# Patient Record
Sex: Male | Born: 1940 | Race: White | Hispanic: No | Marital: Married
Health system: Southern US, Community
[De-identification: ages and names within clinical notes are randomized; demographics above are authoritative.]

---

## 1998-04-11 ENCOUNTER — Emergency Department (HOSPITAL_COMMUNITY): Admission: EM | Admit: 1998-04-11 | Discharge: 1998-04-11 | Payer: Self-pay | Admitting: Emergency Medicine

## 2003-10-22 ENCOUNTER — Other Ambulatory Visit: Payer: Self-pay

## 2007-01-23 ENCOUNTER — Ambulatory Visit (HOSPITAL_COMMUNITY): Admission: RE | Admit: 2007-01-23 | Discharge: 2007-01-23 | Payer: Self-pay | Admitting: Cardiovascular Disease

## 2007-02-12 ENCOUNTER — Inpatient Hospital Stay (HOSPITAL_COMMUNITY): Admission: RE | Admit: 2007-02-12 | Discharge: 2007-02-13 | Payer: Self-pay | Admitting: *Deleted

## 2007-02-12 ENCOUNTER — Ambulatory Visit: Payer: Self-pay | Admitting: *Deleted

## 2007-02-12 ENCOUNTER — Encounter (INDEPENDENT_AMBULATORY_CARE_PROVIDER_SITE_OTHER): Payer: Self-pay | Admitting: Specialist

## 2007-03-01 ENCOUNTER — Ambulatory Visit: Payer: Self-pay | Admitting: *Deleted

## 2007-09-20 ENCOUNTER — Ambulatory Visit: Payer: Self-pay | Admitting: *Deleted

## 2009-04-14 ENCOUNTER — Emergency Department: Payer: Self-pay

## 2010-07-01 ENCOUNTER — Emergency Department: Payer: Self-pay | Admitting: Emergency Medicine

## 2011-02-15 NOTE — Op Note (Signed)
NAME:  MICHOLAS, DRUMWRIGHT NO.:  [id number]   MEDICAL RECORD NO.:  [id number]          PATIENT TYPE:  INP   LOCATION:  3304                         FACILITY:  [facility]   PHYSICIAN:  Balinda Quails, M.D.    DATE OF BIRTH:  December 31, 1940   DATE OF PROCEDURE:  02/12/2007  DATE OF DISCHARGE:                               OPERATIVE REPORT   SURGEON:  Denman George, M.D.   ASSISTANT:  This nurse.   ANESTHETIC:  General endotracheal.   ANESTHESIOLOGIST:  Dr. Jacklynn Bue   PREOPERATIVE DIAGNOSIS:  Severe left internal carotid artery stenosis.   POSTOPERATIVE DIAGNOSIS:  Severe left internal carotid artery stenosis.   PROCEDURE:  Left carotid endarterectomy with Dacron patch angioplasty.   CLINICAL NOTE:  Patrick Mccarthy is a 70 year old male referred by Dr.  Allyson Sabal after cerebral arteriography revealed a severe internal carotid  artery stenosis on the left.  He was seen in consultation, recommended  he undergo left carotid endarterectomy for reduction of stroke risk.  He  consented for surgery.  Brought to the operating at this time for left  carotid endarterectomy.  Risks and benefits the operative procedure  explained to the patient in detail with a major morbidity mortality of 1-  2% to include but not limited to MI, CVA, cranial nerve injury and  stroke.   OPERATIVE PROCEDURE:  The patient brought to the operating room in  stable hemodynamic condition.  Placed under general endotracheal  anesthesia.  Arterial line, Foley catheter in place.  Left neck prepped  and draped in sterile fashion.   Curvilinear skin incision made along the anterior border left  sternomastoid muscle.  Subcutaneous tissue and platysma divided with  electrocautery.  Deep dissection carried down along the sternomastoid  muscle.  Facial vein ligated with 3-0 silk and divided.  The carotid  bifurcation exposed.  The common carotid artery mobilized down to the  omohyoid muscle encircled with vessel  loop.  Vagus nerve reflected  posteriorly and preserved.  The superior thyroid external carotid freed  and encircled Vessel loops.  The patient did develop some bradycardia,  the carotid body was injected with 1% Xylocaine.  The internal carotid  artery then followed distally up the posterior belly of the digastric  muscle.  The hypoglossal nerve retracted superiorly.  The distal  internal carotid artery encircled with vessel loop.   Inspection of the carotid bifurcation verified extensive plaque disease  extending into the origin of left internal carotid artery.   The patient administered 7000 units heparin intravenously.  Adequate  circulation time admitted.  Carotid vessels controlled with clamps.  Longitudinal arteriotomy made in the distal common carotid artery.  The  arteriotomy extended across carotid bulb and up into the internal  carotid artery.  There was a large amount of plaque in the origin of the  left internal carotid artery with a high-grade stenosis estimated to be  greater than 90%.  Shunt was inserted.   The plaque removed with endarterectomy elevator.  The endarterectomy  carried down to the common carotid artery where plaque was divided  transversely Potts  scissors.  Plaque then raised up to the bulb with  superior thyroid external carotid were endarterectomized using an  eversion technique.  The distal internal carotid plaque feathered out  well.   The site irrigated with heparin saline solution and fragments of plaque  removed with fine forceps.  The patch angioplasty of the endarterectomy  site carried out with a finesse Dacron patch using running 6-0 Prolene  suture.  The shunt then removed.  All vessels well flushed.  Clamps  removed directing initial antegrade flow up the external carotid artery,  following this internal carotid was released.   Adequate hemostasis obtained.  The patient administered 50 mg protamine  intravenously.  Excellent Doppler signal  in the left internal carotid  artery.   The sternomastoid fascia closed with running 2-0 Vicryl suture.  Platysma closed running 3-0 Vicryl suture.  Skin closed with 4-0  Monocryl.  Dermabond applied.  Sterile dressing applied.   The patient tolerated procedure well.  Transferred to recovery room in  stable condition.      Balinda Quails, M.D.  Electronically Signed     PGH/MEDQ  D:  02/12/2007  T:  02/12/2007  Job:  [id number]   cc:   Nanetta Batty, M.D.

## 2011-02-15 NOTE — Assessment & Plan Note (Signed)
OFFICE VISIT   PRESTEN, JOOST  DOB:  [birth year]                                       03/01/2007  ZOXWR#:[id number]   HISTORY:  This is a 70 year old male status post left carotid  endarterectomy for severe stenosis carried out 02/12/07.  This was an  uneventful operative procedure, he was discharged home in good condition  02/13/07.  He did have some mild swelling in his neck following surgery.   At this time he has no  major complaints.  Blood pressure is 164/68 in  the right arm, 143/77 in the left arm.  Pulse is 57 per minute,  respirations 18 per minute.  Left neck incision healing unremarkably.  Mild to moderate residual swelling noted.  Cranial nerves intact.  Strength equal bilaterally.   The patient is taking aspirin daily.  To continue aspirin as instructed.  Return in six months for protocol follow-up carotid Doppler.  No  apparent complications evident at this time.   Balinda Quails, M.D.  Electronically Signed   PGH/MEDQ  D:  03/01/2007  T:  03/01/2007  Job:  [id number]   cc:   Nanetta Batty, M.D.

## 2011-02-15 NOTE — Discharge Summary (Signed)
NAME:  Patrick Mccarthy, Patrick Mccarthy NO.:  [id number]   MEDICAL RECORD NO.:  [id number]          PATIENT TYPE:  INP   LOCATION:  3304                         FACILITY:  [facility]   PHYSICIAN:  Balinda Quails, M.D.    DATE OF BIRTH:  Nov 05, 1940   DATE OF ADMISSION:  02/12/2007  DATE OF DISCHARGE:  02/13/2007                               DISCHARGE SUMMARY   ADMISSION DIAGNOSIS:  Severe left internal carotid artery stenosis.   DISCHARGE/SECONDARY DIAGNOSES:  1. Severe left internal carotid artery stenosis, asymptomatic, status      post left carotid endarterectomy.  2. Diabetes mellitus type 2.  3. Chronic obstructive pulmonary disease.  4. Hypertension.  5. Peripheral vascular disease.  6. Hyperlipidemia.  7. Ongoing tobacco abuse.  8. Obesity.  9. History of umbilical hernia repair in the 1980s.  10.History of appendectomy.  11.Allergic rhinitis.  12.History of tension headaches.  13.Osteoarthritis, particularly of the knees.   ALLERGIES:  NO KNOWN DRUG ALLERGIES.   PROCEDURE:  Feb 12, 2007, left carotid endarterectomy with Dacron patch  angioplasty by Dr. Denman George.   BRIEF HISTORY:  Patrick Mccarthy is a 70 year old Caucasian male with history  of diabetes mellitus and tobacco abuse.  He underwent cerebral  angiography on January 23, 2007, revealing severe left internal carotid  artery stenosis.  Some intracerebral atherosclerotic vascular disease  was also noted.  Dominant vertebral flow was to the left.  There was no  significant right internal carotid artery stenosis.  Based on these  results, Dr. Allyson Sabal asked vascular surgeon, Dr. Madilyn Fireman, to see in  consultation for consideration of carotid endarterectomy.  Dr. Madilyn Fireman  recommended left carotid endarterectomy to reduce his risk for future  stroke.   HOSPITAL COURSE:  Patrick Mccarthy was electively admitted to [facility] on Feb 12, 2007, and underwent a left carotid endarterectomy.  He was extubated neurologically  intact and after short stay in recovery  unit was transferred to step-down unit 3300 where he remained until  discharge.  Overnight he remained stable and on the morning of  postoperative day #1, he was already voiding, ambulating and tolerating  p.o.'s.  He denied dysphagia, nausea and vomiting or shortness of  breath.  Vitals were stable with blood pressure 134/83, heart rate was  in sinus rhythm, primarily in the mid 50s to mid 60s.  He was saturating  94% on room air and was afebrile.  Blood glucose levels were suboptimal  with some over 200 and his home regimen of antihyperglycemics were  initiated as well as NovoLog insulin sliding scale.  His postoperative  labs were stable showing a white blood count of 10.8, hemoglobin 12.2,  hematocrit 36.0, platelet count 201.  Sodium 136, potassium 4, BUN 11,  creatinine 0.84, blood glucose 206.  On exam, heart had a regular rate  and rhythm.  Lung sounds showed fine basilar crackles and abdominal exam  was benign.  Neurologically he was intact and his tongue was midline.  His incision showed some mild soft tissue swelling and evidence of a  probable tiny hematoma at  the superior aspect of his incision, but again  this was quite small and the patient was asymptomatic.  There is no  erythema.  His hemoglobin and hematocrit have remained stable  postoperatively.  By later that morning Mr. Erway was felt appropriate  for discharge home in stable condition.  He is discharged home on postop  day #2, December 14, 2006.   DISCHARGE MEDICATIONS:  1. Tylox 1-2 tablets p.o. q.4h. p.r.n. pain.  2. Pravastatin 80 mg p.o. q.a.m.  3. Metformin 500 mg 2 tablets p.o. q.a.m. and 500 mg p.o. q.p.m.  4. Aspirin 325 mg p.o. daily.  5. Lisinopril 20 mg p.o. q.p.m.  6. Metoprolol 50 mg p.o. b.i.d.  7. Glipizide ER 10 mg p.o. q.a.m.  8. Zetia 10 mg p.o. q.a.m.  9. Aleve 2 tablets p.o. q.12h. p.r.n.   DISCHARGE INSTRUCTIONS:  He is to increase activity slowly,  avoid  driving or heavy lifting for the next 2 weeks.  He may shower and clean  his incisions with soap and water.  He should call if he develops  redness or drainage for his incision site or fever than 101.  He is to  continue diabetic appropriate diet.  He is to follow up with Dr. Madilyn Fireman  at the [facility] office on Mar 01, 2007, at 11:30 a.m.      Patrick Mccarthy, P.A.      Balinda Quails, M.D.  Electronically Signed    AWZ/MEDQ  D:  02/13/2007  T:  02/13/2007  Job:  [id number]   cc:   Patrick Mccarthy, M.D.  Patrick Mccarthy

## 2011-02-15 NOTE — Procedures (Signed)
CAROTID DUPLEX EXAM   INDICATION:  Followup of known carotid artery disease.  Patient is  asymptomatic.   HISTORY:  Diabetes:  Yes.  Cardiac:  No.  Hypertension:  Yes.  Smoking:  Yes, one pack per day.  Patient with COPD.  Previous Surgery:  Left CEA with DPA on 02/12/07 by Dr. Madilyn Fireman.  CV History:  Amaurosis Fugax No, Paresthesias No, Hemiparesis No                                       RIGHT             LEFT  Brachial systolic pressure:         168               148  Brachial Doppler waveforms:         WNL               Slightly  diminished  Vertebral direction of flow:        Antegrade         Antegrade  (dominant)  DUPLEX VELOCITIES (cm/sec)  CCA peak systolic                   89                103  ECA peak systolic                   148               104  ICA peak systolic                   59                47  ICA end diastolic                   20                23  PLAQUE MORPHOLOGY:                  Homogenous        None  PLAQUE AMOUNT:                      Minimal           N/A  PLAQUE LOCATION:                    Bifurcation, PICA N/A   IMPRESSION:  1. Right 1-19% internal carotid artery stenosis.  2. Left internal carotid artery without recurrent stenosis, status      post carotid endarterectomy.  3. Patent external carotid arteries bilaterally.  4. Bilateral antegrade flow in vertebral arteries with left vertebral      artery dominant.  5. Known differential in brachial systolic pressures, right > left.   ___________________________________________  P. Liliane Bade, M.D.   PB/MEDQ  D:  09/20/2007  T:  09/21/2007  Job:  [id number]

## 2011-02-18 NOTE — Consult Note (Signed)
NAME:  [name], [name] NO.:  [id number]   MEDICAL RECORD NO.:  [id number]          PATIENT TYPE:  AMB   LOCATION:  SDS                          FACILITY:  [facility]   PHYSICIAN:  Balinda Quails, M.D.    DATE OF BIRTH:  04/10/1941   DATE OF CONSULTATION:  01/23/2007  DATE OF DISCHARGE:                                 CONSULTATION   PRIMARY CARE PHYSICIAN:  Dewaine Oats.   REASON FOR CONSULTATION:  Severe left internal carotid artery stenosis.   HISTORY:  Patrick Mccarthy is a 70 year old male who is status post four-  vessel cerebral arteriography today.  He was referred by Dr. Allyson Sabal  following arteriography which reveals a severe left internal carotid  artery stenosis.  This was initially picked up on carotid Doppler  evaluation.   The patient has no history of stroke.  He denies sensory, motor, or  visual deficit.  Denies gait abnormality.   PAST MEDICAL HISTORY:  1. Type 2 diabetes.  2. Tobacco abuse.  3. Peripheral vascular disease.  4. COPD.  5. Hypertension.  6. Hyperlipidemia.  7. Obesity.   MEDICATIONS:  1. Pravastatin 80 mg daily.  2. Metformin 1000 mg every morning and 500 mg every evening.  3. Aspirin 325 mg daily.  4. Lisinopril 20 mg every bedtime.  5. Metoprolol ER 50 mg twice a day.  6. Glipizide ER 10 mg daily.  7. Zetia 10 mg daily.   ALLERGIES:  NONE KNOWN.   REVIEW OF SYSTEMS:   Dictation ended at this point.      Balinda Quails, M.D.  Electronically Signed     PGH/MEDQ  D:  01/23/2007  T:  01/23/2007  Job:  [id number]   cc:   Nanetta Batty, M.D.  Dewaine Oats

## 2011-02-18 NOTE — Cardiovascular Report (Signed)
NAME:  ARDON, FRANKLIN NO.:  [id number]   MEDICAL RECORD NO.:  [id number]          PATIENT TYPE:  AMB   LOCATION:  SDS                          FACILITY:  [facility]   PHYSICIAN:  Nanetta Batty, M.D.   DATE OF BIRTH:  1940-12-23   DATE OF PROCEDURE:  01/23/2007  DATE OF DISCHARGE:                            CARDIAC CATHETERIZATION   PERIPHERAL ANGIOGRAM REPORT:  Mr. Pyon is a 70 year old gentleman, a patient of Dr. Verl Dicker and Dr.  Maree Krabbe.  He has a history of COPD, diabetes and asymptomatic carotid  disease, as well as claudication.  He also has non-insulin-requiring  diabetes and hyperlipidemia.  He presents now after having undergone  cerebral angiography for abdominal aortography with bifemoral runoff.   PROCEDURE DESCRIPTION:  The 5-French sheath was used that was placed in  the right femoral artery using the standard Seldinger technique, with a  long tennis racket catheter and Visipaque dye.  Retrograde aortic  pressures monitored during the case.   ANGIOGRAPHIC RESULTS:  1. Abdominal aorta.      a.     Renal arteries - normal.      b.     Internal abdominal aortographs - normal.  2. Left lower extremity; 80% segmental mid left SFA stenosis with two-      vessel runoff.  Anterior tibial appeared to be occluded.  3. Right lower extremity; 30-40% ostial right common iliac artery      stenosis.      a.     80% segmental mid-right SFA with two-vessel runoff to the       anterior tibial appear to be occluded.   IMPRESSION:  Mr. Purtee has high-grade segmental mid bilateral SFA  disease amenable to endovascular therapy.  Because of his carotid  disease, we will plan on treating the carotid first followed by staged  treatment of his SFA for symptomatic claudication.   The sheath was removed and pressure to the groin which achieved good  hemostasis.  The patient left the lab in stable condition.  He will be  hydrated, discharged home and seen as an outpatient, to  see Dr. Jacinto Halim  back in follow-up.  Dr. Jacinto Halim was made aware of these results.      Nanetta Batty, M.D.  Electronically Signed     JB/MEDQ  D:  01/23/2007  T:  01/23/2007  Job:  [id number]   cc:   [facility]  [place] R. Jacinto Halim, MD  Dewaine Oats

## 2011-02-18 NOTE — Consult Note (Signed)
NAME:  Patrick Mccarthy, VANTINE NO.:  [id number]   MEDICAL RECORD NO.:  [id number]          PATIENT TYPE:  AMB   LOCATION:  SDS                          FACILITY:  [facility]   PHYSICIAN:  Balinda Quails, M.D.    DATE OF BIRTH:  [birth year]   DATE OF CONSULTATION:  01/23/2007  DATE OF DISCHARGE:                                 CONSULTATION   REFERRING PHYSICIAN:  Nanetta Batty M.D.   PRIMARY CARE PHYSICIAN:  Dewaine Oats.   REASON FOR CONSULTATION:  Severe left internal carotid artery stenosis.   HISTORY:  Mr. Cornellius Kropp is a 70 year old male with a history of  diabetes and tobacco abuse.  He underwent cerebral arteriography today.  This reveals a severe left internal carotid artery stenosis.  He also  does have some intracerebral atherosclerotic vascular disease.  Dominant  vertebral flow to the left.  No significant right ICA stenosis.   The patient denies a history of stroke.  No sensory, motor, or visual  deficit.  No speech problems.  No gait abnormalities.   PAST MEDICAL HISTORY:  1. Type 2 diabetes.  2. COPD.  3. Hypertension.  4. Peripheral vascular disease.  5. Hyperlipidemia.  6. Tobacco abuse.  7. Obesity.   MEDICATIONS:  1. Pravastatin 80 mg daily.  2. Metformin 1000 mg q. a.m. and 500 mg q. p.m.  3. Aspirin 325 mg daily.  4. Lisinopril 20 mg nightly.  5. Metoprolol ER 50 mg b.i.d.  6. Glipizide ER 10 mg daily.  7. Zetia 10 mg daily.   ALLERGIES:  None known.   REVIEW OF SYSTEMS:  The patient notes some shortness of breath with  ambulation.  Occasional chest pain.  He has bilateral thigh and calf  claudication symptoms.  Denies rest pain or night pain.  Does have some  numbness in his feet.  Occasional lightheadedness and dizziness.   SOCIAL HISTORY:  The patient is married.  He continues to use tobacco,  about a pack a day, 30-pack-year history.  Does not exercise regularly.  He is retired.  Does not consume alcohol on a regular basis.  No  illicit  drug use.   FAMILY HISTORY:  The patient denies a history of premature coronary  disease.  Father died age 2 with a stroke and diabetes.  Mother alive  at age 6.  Two brothers who are younger than he is and two sisters who  are younger, with no history of coronary disease.   PHYSICAL EXAMINATION:  GENERALLY:  Well-appearing 70 year old male.  Alert, oriented.  VITAL SIGNS:  His blood pressure is 120/90, pulse is 64 per minute,  respirations are 18 per minute.  NECK:  He is a soft left carotid bruit.  Neck is supple without  thyromegaly or adenopathy.  CHEST:  Clear with equal air entry bilaterally.  No rales or rhonchi.  HEART SOUNDS:  Normal without murmurs.  ABDOMEN:  Soft and nontender.  Moderate obesity.  No masses,  organomegaly.  Normal bowel sounds.  EXTREMITIES:  With 2+ femoral pulses bilaterally, 1+ popliteal and  posterior tibial pulse bilaterally.  INVESTIGATIONS:  Cerebral arteriography as noted above.  Severe left  internal carotid artery stenosis by Doppler and arteriography.   IMPRESSION:  1. Severe asymptomatic left internal carotid artery stenosis.  2. Type 2 diabetes.  3. Hypertension.  4. Peripheral vascular disease.  5. Chronic obstructive pulmonary disease.  6. Tobacco abuse.  7. Hyperlipidemia.   RECOMMENDATIONS:  Left carotid endarterectomy for reduction of stroke  risk.  This will be scheduled electively through the office.      Balinda Quails, M.D.  Electronically Signed     PGH/MEDQ  D:  01/23/2007  T:  01/23/2007  Job:  [id number]   cc:   Balinda Quails, M.D.  Dewaine Oats

## 2011-02-18 NOTE — Cardiovascular Report (Signed)
NAME:  ARTRELL, LAWLESS NO.:  [id number]   MEDICAL RECORD NO.:  [id number]          PATIENT TYPE:  AMB   LOCATION:  SDS                          FACILITY:  [facility]   PHYSICIAN:  Nanetta Batty, M.D.   DATE OF BIRTH:  September 16, 1941   DATE OF PROCEDURE:  DATE OF DISCHARGE:                            CARDIAC CATHETERIZATION   Mr. Longsworth is a 70 year old gentleman with history of COPD, diabetes, and  asymptomatic carotid disease found on duplex ultrasound with Dopplers  performed in our office January 15, 2007 revealing high-grade left ICA  stenosis.  There was a question of left subclavian stenosis as well;  however, this was not angiographically documented.  He also has  claudication with SFA disease bilaterally by  ultrasound in our office  as well.  He presents now for cerebral angiography with staged lower  extremity angiography.   DESCRIPTION OF PROCEDURE:  The patient brought to the second floor [facility] angiographic suite in the postabsorptive state.  He was not  premedicated.  His right groin was prepped and shaved in the usual  sterile fashion.  1% Xylocaine was used for local anesthesia.  A 5-  French sheath was inserted into the right femoral artery using standard  Seldinger technique.  A 5-French tennis racket catheter and JV1  catheters were used for arch angiography and four-vessel cerebral  angiography.  Visipaque dye was used for the entirety of the case.  The  aortic pressure was monitored during the case.   ANGIOGRAPHIC RESULTS:  1. Arch aorta; type 1 arch.  2. Right vertebral; nonselectively visualized with 50% ostial      stenosis.  This appeared to be a small vessel.  3. Right carotid; normal, filling the anterior cerebral as well.      There were some minor blockages of the intracranial cerebral      vessels that were not high-grade.  4. Left carotid; 99% proximal left ICA stenosis.  This was a small      underfilled vessel that did not fill  the anterior cerebral.  There      was also 40% mid-left common carotid stenosis and a 40-50% ostial      left common carotid stenosis.  5. Left vertebral; large dominant vessel was free of significant      disease.   IMPRESSION:  Mr. Schnoor has high-grade left internal carotid artery  stenosis.  He is asymptomatic.  He has a negative Myoview.  We will  discuss the possibility of carotid artery stenting in the asymptomatic  capture protocol versus carotid endarterectomy.      Nanetta Batty, M.D.  Electronically Signed    JB/MEDQ  D:  01/23/2007  T:  01/23/2007  Job:  [id number]   cc:   2nd Floor [facility]  [facility]  [place] R. Jacinto Halim, MD  Dewaine Oats

## 2011-10-23 ENCOUNTER — Inpatient Hospital Stay: Payer: Self-pay | Admitting: *Deleted

## 2011-10-23 LAB — URINALYSIS, COMPLETE
Bacteria: NONE SEEN
Bilirubin,UR: NEGATIVE
Glucose,UR: >=500 mg/dL (ref 0–75)
Leukocyte Esterase: NEGATIVE
Nitrite: NEGATIVE
Specific Gravity: 1.014 (ref 1.003–1.030)
WBC UR: 1 /HPF (ref 0–5)

## 2011-10-23 LAB — COMPREHENSIVE METABOLIC PANEL
BUN: 22 mg/dL — ABNORMAL HIGH (ref 7–18)
Calcium, Total: 9 mg/dL (ref 8.5–10.1)
Creatinine: 1.45 mg/dL — ABNORMAL HIGH (ref 0.60–1.30)
EGFR (African American): >60
EGFR (Non-African Amer.): 51 — ABNORMAL LOW
Potassium: 4 mmol/L (ref 3.5–5.1)
SGOT(AST): 16 U/L (ref 15–37)
SGPT (ALT): 20 U/L
Total Protein: 6.7 g/dL (ref 6.4–8.2)

## 2011-10-23 LAB — CBC
MCHC: 34.4 g/dL (ref 32.0–36.0)
RDW: 13.5 % (ref 11.5–14.5)

## 2011-10-24 DIAGNOSIS — I6789 Other cerebrovascular disease: Secondary | ICD-10-CM

## 2011-10-24 LAB — CBC WITH DIFFERENTIAL/PLATELET
Basophil #: 0.1 10*3/uL (ref 0.0–0.1)
Eosinophil #: 0.2 10*3/uL (ref 0.0–0.7)
HGB: 11.8 g/dL — ABNORMAL LOW (ref 13.0–18.0)
Lymphocyte #: 1.2 10*3/uL (ref 1.0–3.6)
Lymphocyte %: 15.5 %
MCHC: 33.5 g/dL (ref 32.0–36.0)
Neutrophil #: 5.6 10*3/uL (ref 1.4–6.5)
Platelet: 152 10*3/uL (ref 150–440)
RBC: 4.12 10*6/uL — ABNORMAL LOW (ref 4.40–5.90)
RDW: 14.1 % (ref 11.5–14.5)
WBC: 7.6 10*3/uL (ref 3.8–10.6)

## 2011-10-24 LAB — LIPID PANEL
Cholesterol: 117 mg/dL (ref 0–200)
HDL Cholesterol: 40 mg/dL (ref 40–60)
Ldl Cholesterol, Calc: 59 mg/dL (ref 0–100)
Triglycerides: 91 mg/dL (ref 0–200)

## 2011-10-24 LAB — BASIC METABOLIC PANEL
Anion Gap: 11 (ref 7–16)
BUN: 19 mg/dL — ABNORMAL HIGH (ref 7–18)
Calcium, Total: 8.4 mg/dL — ABNORMAL LOW (ref 8.5–10.1)
Co2: 26 mmol/L (ref 21–32)
Creatinine: 1.25 mg/dL (ref 0.60–1.30)
EGFR (African American): >60
Osmolality: 293 (ref 275–301)
Potassium: 3.8 mmol/L (ref 3.5–5.1)

## 2011-10-24 LAB — CK TOTAL AND CKMB (NOT AT ARMC)
CK, Total: 122 U/L (ref 35–232)
CK-MB: 1.9 ng/mL (ref 0.5–3.6)

## 2011-10-24 LAB — HEMOGLOBIN A1C: Hemoglobin A1C: 9.3 % — ABNORMAL HIGH (ref 4.2–6.3)

## 2011-11-11 ENCOUNTER — Encounter: Payer: Self-pay | Admitting: Internal Medicine

## 2011-12-02 ENCOUNTER — Encounter: Payer: Self-pay | Admitting: Internal Medicine

## 2011-12-20 ENCOUNTER — Emergency Department: Payer: Self-pay | Admitting: Emergency Medicine

## 2011-12-20 LAB — COMPREHENSIVE METABOLIC PANEL
Anion Gap: 13 (ref 7–16)
Calcium, Total: 10.3 mg/dL — ABNORMAL HIGH (ref 8.5–10.1)
Chloride: 106 mmol/L (ref 98–107)
EGFR (African American): >60
Glucose: 109 mg/dL — ABNORMAL HIGH (ref 65–99)
Osmolality: 294 (ref 275–301)
Potassium: 4.2 mmol/L (ref 3.5–5.1)
SGOT(AST): 18 U/L (ref 15–37)
SGPT (ALT): 19 U/L

## 2011-12-20 LAB — CBC
HGB: 13.1 g/dL (ref 13.0–18.0)
MCH: 29.1 pg (ref 26.0–34.0)
Platelet: 218 10*3/uL (ref 150–440)
RBC: 4.5 10*6/uL (ref 4.40–5.90)
RDW: 14.4 % (ref 11.5–14.5)

## 2011-12-20 LAB — URINALYSIS, COMPLETE
Bilirubin,UR: NEGATIVE
Blood: NEGATIVE
Granular Cast: 6
Hyaline Cast: 3
Ketone: NEGATIVE
Leukocyte Esterase: NEGATIVE
Ph: 5 (ref 4.5–8.0)
Protein: 100
RBC,UR: 1 /HPF (ref 0–5)
Squamous Epithelial: <1

## 2011-12-20 LAB — PROTIME-INR: Prothrombin Time: 12.2 secs (ref 11.5–14.7)

## 2013-08-25 ENCOUNTER — Emergency Department: Payer: Self-pay | Admitting: Emergency Medicine

## 2013-08-25 LAB — LIPASE, BLOOD: Lipase: 154 U/L (ref 73–393)

## 2013-08-25 LAB — CBC WITH DIFFERENTIAL/PLATELET
Basophil #: 0.1 10*3/uL (ref 0.0–0.1)
Eosinophil #: 0.2 10*3/uL (ref 0.0–0.7)
Eosinophil %: 1.6 %
HCT: 31 % — ABNORMAL LOW (ref 40.0–52.0)
Lymphocyte #: 1.3 10*3/uL (ref 1.0–3.6)
Lymphocyte %: 9.7 %
MCH: 28.2 pg (ref 26.0–34.0)
MCHC: 32.5 g/dL (ref 32.0–36.0)
Monocyte %: 4.9 %
Neutrophil #: 11.1 10*3/uL — ABNORMAL HIGH (ref 1.4–6.5)
Neutrophil %: 82.9 %
Platelet: 222 10*3/uL (ref 150–440)
RBC: 3.58 10*6/uL — ABNORMAL LOW (ref 4.40–5.90)
WBC: 13.4 10*3/uL — ABNORMAL HIGH (ref 3.8–10.6)

## 2013-08-25 LAB — TROPONIN I: Troponin-I: 0.47 ng/mL — ABNORMAL HIGH

## 2013-08-25 LAB — PROTIME-INR
INR: 1
Prothrombin Time: 12.9 secs (ref 11.5–14.7)

## 2013-08-25 LAB — PRO B NATRIURETIC PEPTIDE: B-Type Natriuretic Peptide: 3800 pg/mL — ABNORMAL HIGH (ref 0–125)

## 2013-08-25 LAB — MAGNESIUM: Magnesium: 1.6 mg/dL — ABNORMAL LOW

## 2013-08-25 LAB — CK TOTAL AND CKMB (NOT AT ARMC): CK, Total: 145 U/L (ref 35–232)

## 2013-08-30 LAB — CULTURE, BLOOD (SINGLE)

## 2013-09-13 IMAGING — CR DG CHEST 2V
1 series · 2 of 2 positions shown · non-contrast
Comparison: none

REASON FOR EXAM: CVA
COMMENTS:   May transport without cardiac monitor

PROCEDURE:     DXR - DXR CHEST PA (OR AP) AND LATERAL  - October 23, 2011  [DATE]
RESULT:
The lungs are clear. The cardiovascular structures are unremarkable. Chest
is stable from 0699.

[Series 1: ap · 0.17mm/px · 2 of 2 slices shown]
[im 1/2]
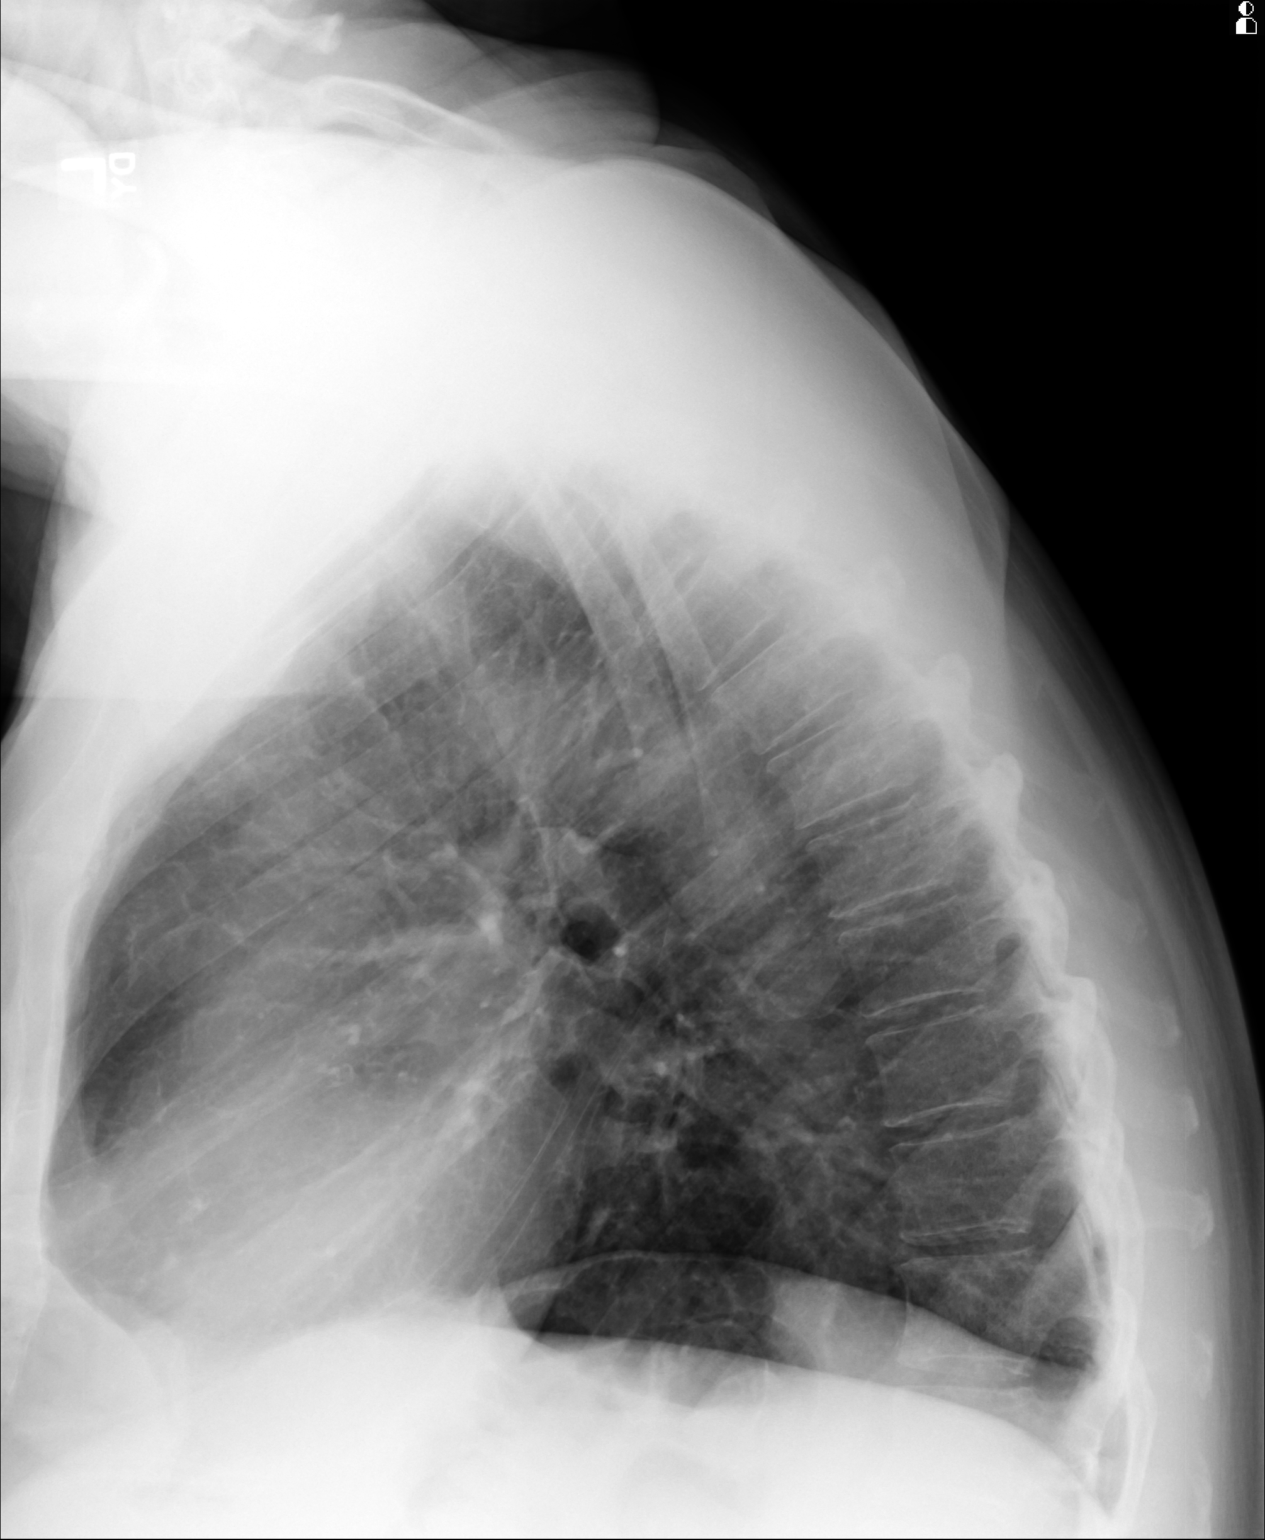
[im 2/2]
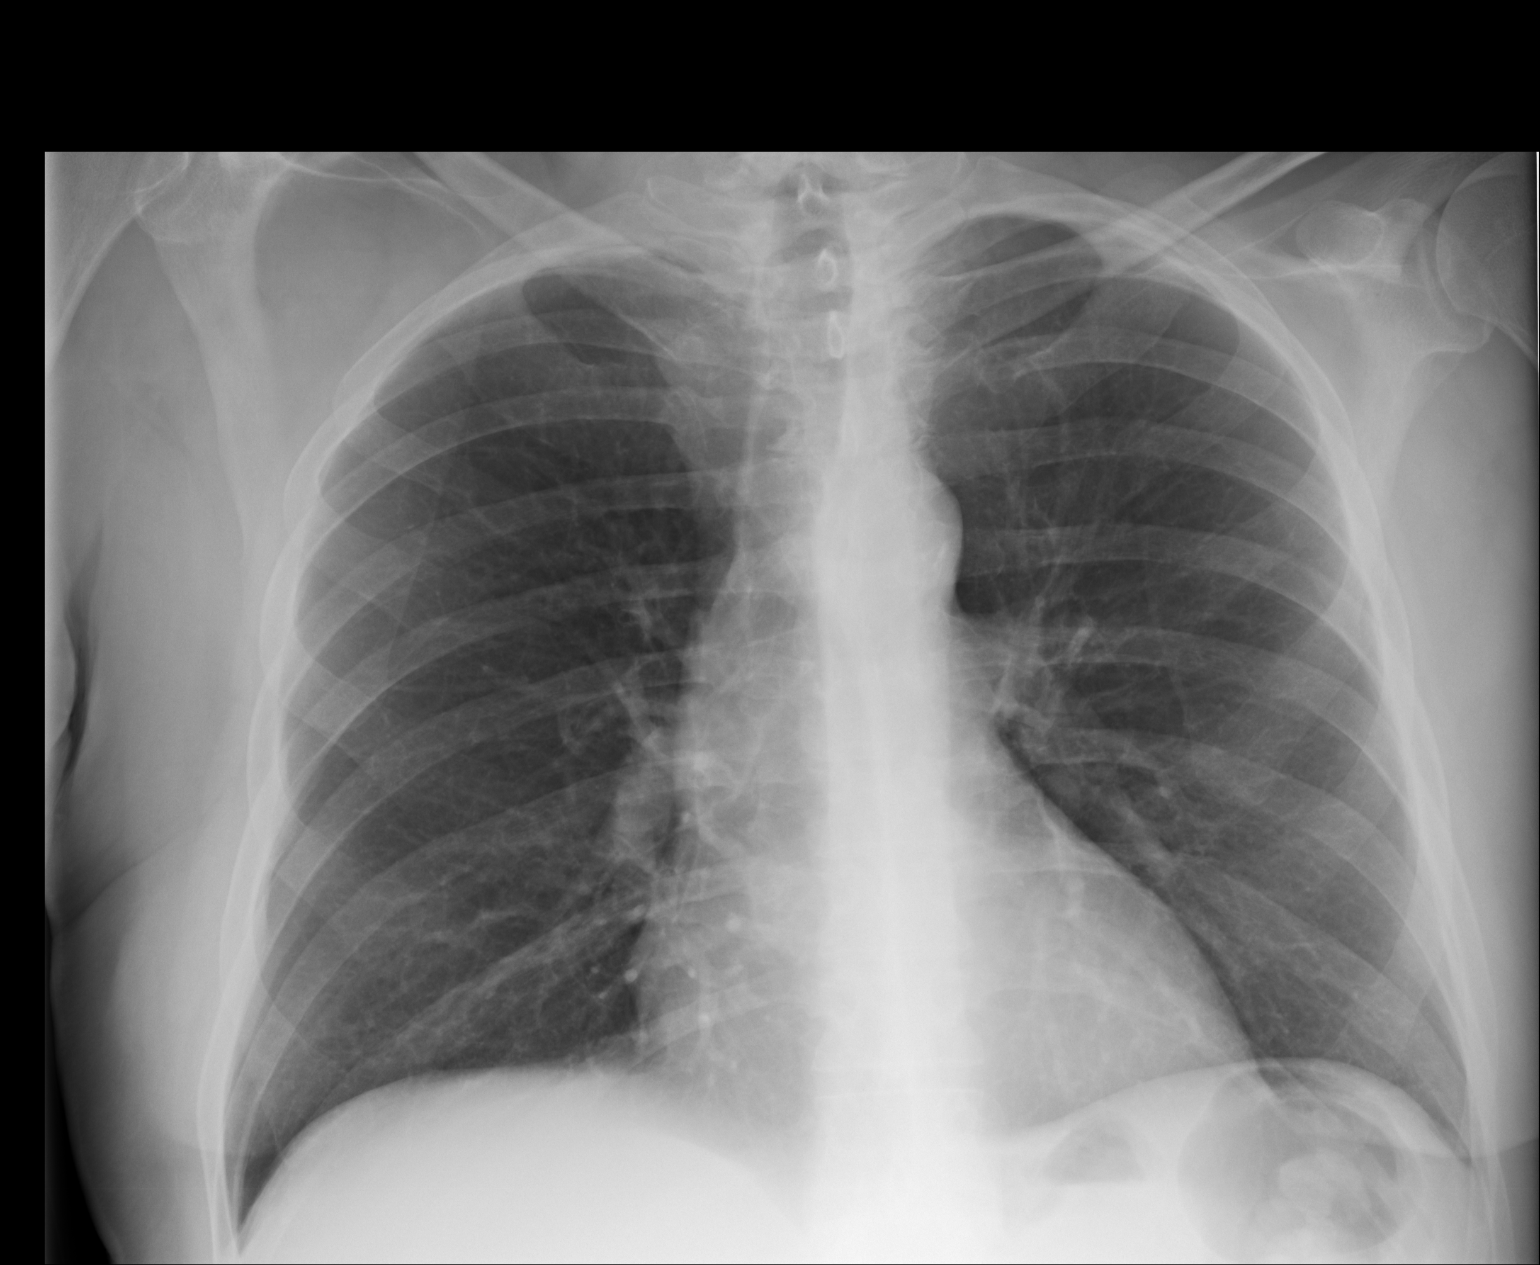

[2 of 2 positions shown; findings below may reference images not displayed]

IMPRESSION: No acute abnormality.

## 2013-10-03 DEATH — deceased

## 2015-01-25 NOTE — Consult Note (Signed)
Past Medical/Surgical Hx:  Hyperlipidemia:   HTN:   Diabetes:   endarectomy:   Home Medications:   pravastatin 80 mg oral tablet: 1 tab(s) orally once a day (at bedtime), 21-Jan-13 13:41   glipiZIDE 10 mg oral tablet: tab(s) orally 2 times a day, 21-Jan-13 13:41   metformin 1000 mg oral tablet: 1 tab(s) orally 2 times a day, 21-Jan-13 13:41   lisinopril 20 mg oral tablet: tab(s) orally 2 times a day, 21-Jan-13 13:41  Allergies:  No Known Allergies:   Vital Signs: **Vital Signs.:   21-Jan-13 16:38   Vital Signs Type POCT   Nurse Fingerstick (mg/dL) FSBS (fasting range 65-99 mg/dL) 224   Comments/Interventions  Nurse Notified   Lab Results:  Thyroid:  21-Jan-13 03:56    Thyroid Stimulating Hormone 0.901  Hepatic:  20-Jan-13 20:03    Bilirubin, Total 0.4   Alkaline Phosphatase   45   SGPT (ALT) 20   SGOT (AST) 16   Total Protein, Serum 6.7   Albumin, Serum 3.4  Routine Chem:  21-Jan-13 03:56    Glucose, Serum   103   BUN   19   Creatinine (comp) 1.25   Sodium, Serum   146   Potassium, Serum 3.8   Chloride, Serum   109   CO2, Serum 26   Calcium (Total), Serum   8.4   Anion Gap 11   Osmolality (calc) 293   eGFR (African American) >60   eGFR (Non-African American) >60   Magnesium, Serum   1.6   Hemoglobin A1c ([facility])   9.3   Cholesterol, Serum 117   Triglycerides, Serum 91   HDL (INHOUSE) 40   VLDL Cholesterol Calculated 18   LDL Cholesterol Calculated 59  Cardiac:  21-Jan-13 11:51    Troponin I 0.03    20:03    CK, Total 122   CPK-MB, Serum 1.9  Routine UA:  20-Jan-13 20:09    Color (UA) Yellow   Clarity (UA) Clear   Glucose (UA) >=500   Bilirubin (UA) Negative   Ketones (UA) Negative   Specific Gravity (UA) 1.014   Blood (UA) Negative   pH (UA) 7.0   Nitrite (UA) Negative   Leukocyte Esterase (UA) Negative   RBC (UA) <1 /HPF   WBC (UA) 1 /HPF   Epithelial Cells (UA) <1 /HPF   Mucous (UA) PRESENT  Routine Coag:  20-Jan-13 20:03     Prothrombin 12.4   INR 0.9  Routine Hem:  21-Jan-13 03:56    WBC (CBC) 7.6   RBC (CBC)   4.12   Hemoglobin (CBC)   11.8   Hematocrit (CBC)   35.4   Platelet Count (CBC) 152   MCV 86   MCH 28.7   MCHC 33.5   RDW 14.1   Neutrophil % 73.3   Lymphocyte % 15.5   Monocyte % 7.2   Eosinophil % 2.9   Basophil % 1.1   Neutrophil # 5.6   Lymphocyte # 1.2   Monocyte # 0.5   Eosinophil # 0.2   Basophil # 0.1   Radiology Results: [place]:    21-Jan-13 09:02, US Carotid Doppler Bilateral   US Carotid Doppler Bilateral    REASON FOR EXAM:    CVA  COMMENTS:       PROCEDURE: [place]  - US CAROTID DOPPLER BILATERAL  - Oct 24 2011  9:02AM     RESULT:     TECHNIQUE: Grayscale, duplex Doppler, color flow and spectral waveform   imaging  was performed of the right and left carotidsystems.    FINDINGS: Minimal soft plaque is identified within the right and left   carotid systems demonstrating less than 50% visual stenosis. Color flow   and spectral waveform imaging is unremarkable.     ICA/CCA ratios:  RIGHT:  1.27     LEFT: 0.45    Antegrade flow is identified within the vertebral arteries.    IMPRESSION:  No sonographic evidence of hemodynamically significant   stenosis within the right or left carotid systems.      Thank you for this opportunity to contribute to the care of your patient.           Verified By: Mikki Santee, M.D., MD  MRI:    21-Jan-13 15:46, MRI Brain Without Contrast   MRI Brain Without Contrast    REASON FOR EXAM:    CVA  COMMENTS:       PROCEDURE: MR  - MR BRAIN WO CONTRAST  - Oct 24 2011  3:46PM     RESULT: History: CVA    Technique: Multiplanar, multisequence MRI of the brain was obtained   without IV contrast.     Comparison:  None    Findings:     There is a focal area of restricted diffusion in the right pons     consistent with an acute infarct. There are old bilateral lacunar   infarcts. There is no hemorrhage. There is no pathologic  extra-axial   fluid collection. There is generalized cerebral atrophy. There is   periventricular and deep white matter T2 and FLAIR hyperintensity likely   secondary to microangiopathy. There is no hydrocephalus. The ventricles   are normal for age. The basal cisterns are patent. There are no abnormal   extra-axial fluid collections.     The visualized paranasal sinuses and mastoid sinuses are clear. The skull   base and calvarium demonstrate normal signal. The major intracranial flow   voids, including dural venous sinuses appear patent.    IMPRESSION:     Acute nonhemorrhagic right pontine infarct.    Verified By: Jennette Banker, M.D., MD  CT:    20-Jan-13 19:03, CT Head Without Contrast   CT Head Without Contrast    REASON FOR EXAM:    CVA  COMMENTS:   May transport without cardiac monitor    PROCEDURE: CT  - CT HEAD WITHOUT CONTRAST  - Oct 23 2011  7:03PM     RESULT: Comparison:  None    Technique: Multiple axial images from the foramen magnum to the vertex   were obtained without IV contrast.    Findings:      There is no evidence of mass effect, midline shift, or extra-axial fluid   collections.  There is no evidence of a space-occupying lesion or   intracranial hemorrhage. There is no evidence of a cortical-based area of     acute infarction. There is an old right corona radiata infarct. A there   is an old left subinsular lacunar infarct. There is an old left thalamic   lacunar infarct. There is generalized cerebral atrophy. There is   periventricular white matter low attenuation likely secondary to   microangiopathy.    The ventricles and sulci are appropriate for the patient's age. The basal   cisterns are patent.    Visualized portions of the orbits are unremarkable. The visualized   portionsof the paranasal sinuses and mastoid air cells are unremarkable.   Cerebrovascular atherosclerotic calcifications  are noted.    The osseous structures are  unremarkable.    IMPRESSION:      No acute intracranial process.          Verified By: Jennette Banker, M.D., MD   Impression/Recommendations:  Recommendations:   Please see my dictation for details. 102111 Stroke: Rt. pontine (upper) paramedian infract - small vessel disease - left face weakness and some slurred speech.2 strokes - right putamen, 2nd - right caudate and corona radiata - Rt. MCA lenticulostriate branches. h/o left Carotid endarterectomyMultiple poorly controlled vascular risk factors. Agree with stroke w/up:avoid extreme  hypo/hyper tension and gentle BP reducation in peristroke period to maintain perfusion in ischemic penumbra.treat fever early (hyperthermia impairs stroke recovery), aggressive PT/OT/ST, early bedside swallow evaluation. Agree to switch from ASA to plavix (but he was not so compliant to ASA)Continue statin  (Lipid lowering and non lipid lowering effects of statin are useful in secondary stroke prevention. e.g. SPARCL http:////www.nejm.org/doi/full/10.1056/NEJMoa061894#t=abstract) pt asked for out pt endocrine consult for DM management (will do)DVT prophylaxis Consider flu vaccine for the opportunity to participate in care of your patient.free to contact me with any further questions on this pt, I will follow this patient as out pt.  Electronic Signatures: Ray Church (MD)  (Signed 21-Jan-13 18:12)  Authored: PAST MEDICAL/SURGICAL HISTORY, HOME MEDICATIONS, ALLERGIES, NURSING VITAL SIGNS, LAB RESULTS, RADIOLOGY RESULTS, Recommendations   Last Updated: 21-Jan-13 18:12 by Ray Church (MD)

## 2015-01-25 NOTE — H&P (Signed)
PATIENT NAME:  Patrick Mccarthy, Patrick Mccarthy MR#:  [id number] DATE OF BIRTH:  January 24, 1941  DATE OF ADMISSION:  10/23/2011  REFERRING PHYSICIAN: Dorothea Glassman, MD   PRIMARY CARE PHYSICIAN: Dewaine Oats, MD  PRESENTING COMPLAINT: Slurred speech, staggering gait, and left facial droop.   HISTORY OF PRESENT ILLNESS: Patrick Mccarthy is a pleasant 74 year old gentleman with history of diabetes, hypertension, hyperlipidemia, and peripheral vascular disease status post left CEA who presents with reports of developing staggering gait as if he were "drunk" with slurred speech and left side facial droop. He denies any confusion. He denies any chest pain or shortness of breath, and no palpitations, presyncope, or syncope. Reports that his speech is improved but still a little bit slurred. No similar episodes in the past. He reports that he fell in the shower approximately 6 months ago and he has had some residual left-sided weakness with some intermittent numbness, but that has been unchanged. He has difficulty with left shoulder mobilization. He denies any difficulty swallowing and no fevers, chills, nausea, or vomiting.   PAST MEDICAL HISTORY:  1. Diabetes.  2. Hypertension.  3. Hyperlipidemia.  4. Peripheral vascular disease, status post left CEA.  5. Probable chronic kidney disease. Back in 2010 trend was revealing for a creatinine of 1.5.   PAST SURGICAL HISTORY:  1. Skin cancer resection from face.  2. Appendectomy.  3. Hernia repair.   ALLERGIES: No known drug allergies.   MEDICATIONS:  1. Aspirin 325 mg daily.  2. Four other medications for his disease above. The patient does not know his medication names. His son will bring in the complete list and bottles.   FAMILY HISTORY: Father had a stroke.   SOCIAL HISTORY: He lives in [place] with his wife. He quit tobacco in 2009. No alcohol or drugs. He is retired from [employer].  REVIEW OF SYSTEMS: CONSTITUTIONAL: No fevers, chills, nausea or vomiting. EYES: No  glaucoma or cataracts. ENT: Denies any dysphagia or painful swallowing. No epistaxis or discharge. No visual disturbance. RESPIRATORY: No cough, wheezing, hemoptysis, or shortness of breath. CARDIOVASCULAR: No chest pain or orthopnea. He reports intermittent lower extremity swelling, sometimes ankle swelling, but resolves. No palpitations or syncope. GI: No nausea or vomiting. He endorses constipation. No abdominal pain, hematemesis, or melena. GU: No dysuria or hematuria. ENDOCRINE: No polyuria or polydipsia. HEMATOLOGIC: No easy bleeding. SKIN: No ulcers. MUSCULOSKELETAL: He reports left shoulder discomfort, left thigh pain, and left low back pain since his fall six months ago with worsening intensity intermittently. NEUROLOGIC: As per history of present illness. PSYCH: He denies any depression or suicidal ideation.   PHYSICAL EXAMINATION:   VITAL SIGNS: Temperature 97.6, pulse 94, respiratory rate 18, blood pressure 206/101, sand saturating 99% on room air.   GENERAL: Lying in bed in no apparent distress.   HEENT: Normocephalic, atraumatic. Pupils are equal and symmetric. Anicteric sclerae. Nares without discharge. Moist mucous membranes. He has noted left facial drooping. No tongue deviation.   NECK: Soft and supple. No adenopathy or JVP. Status post left CEA.  CARDIOVASCULAR: Non-tachy. No murmurs, rubs, or gallops.   LUNGS: Clear to auscultation bilaterally. No use of accessory muscles or increased respiratory effort.   ABDOMEN: Soft and nontender. Positive bowel sounds. No mass appreciated.   EXTREMITIES: 1 to 2+ pitting edema in the left lower extremity and trace edema on the right lower extremity. Dorsal pedis pulses are intact.   MUSCULOSKELETAL: No joint effusion. He has full range of motion on passive evaluation of his bilateral  shoulders, but on active movement he has difficulty in holding up his left shoulder; however, he does have symmetrical squeeze and strength on exam.    NEURO: The patient also has some noted mild slurring of his speech.   PSYCH: The patient is alert, oriented, and cooperative.   PERTINENT LABS AND STUDIES: Urinalysis with specific gravity 1.014, pH 7, RBC less than 1 per high-power field, WBC 1 per high-power field.  WBC 8.3, hemoglobin 13.3, hematocrit 38.7, platelets 193, MCV 86. INR 0.9. Glucose 176, BUN 22, creatinine 1.45, sodium 145, potassium 4, chloride 107, carbon dioxide 27, calcium 9. Total bilirubin 0.4, alkaline phosphatase 45, ALT 20, AST 16, total protein 6.7, albumin 3.4. Troponin 0.04.   EKG with normal sinus rate. He has J-point elevation in his lateral leads.  Chest x-ray is without acute findings   ASSESSMENT AND PLAN: Patrick Mccarthy is a 74 year old gentleman with history of diabetes, hypertension, hyperlipidemia, peripheral vascular disease, and probable chronic kidney disease presenting with slurred speech, gait disturbance, and left facial drooping.  1. Acute cerebrovascular accident: Stop high-dose aspirin and convert to Plavix. We will send for a MRI, carotid Doppler, and echocardiogram. His neurological examination is with some discrepancy of his left upper extremity likely in the setting of recent injury to his left shoulder. We will go ahead and get a left shoulder film for that. He denies any dysphagia but continues to have slurred speech and some left facial droop. Therefore, we will start him on a dysphagia diet and get PT and speech evaluation. EKG with J-point elevation and we will repeat his EKG in the morning. I am unsure what his baseline is.  2. Hypertension, accelerated: Allow for permissive hypertension. Start on beta blocker for now. His medications will be brought in by his family member.  3. Hyperlipidemia: Fasting lipid panel and start on Lipitor for now.  4. Diabetes: Sliding scale insulin and send A1c.  5. Probable chronic kidney disease: Trend since 2010 shows creatinine around 1.5. It appears to be at  baseline, but he is not aware of any renal insufficiency. Continue to follow creatinine, ins and outs, and avoid nephrotoxic agents.  6. Prophylaxis with Lovenox, Plavix, and Protonix.  TIME SPENT: Approximately 45 minutes was spent on patient care.  ____________________________ Reuel DerbyAlounthith Kynnedy Carreno, MD ap:slb D: 10/23/2011 23:33:34 ET T: 10/24/2011 10:10:22 ET JOB#: [id number]  cc: Pearlean BrownieAlounthith Alazne Quant, MD, <Dictator> Jillene Bucksenny C. Arlana Pouchate, MD Reuel DerbyALOUNTHITH Arrielle Mcginn MD ELECTRONICALLY SIGNED 11/05/2011 23:30

## 2015-01-25 NOTE — Consult Note (Signed)
PATIENT NAME:  Patrick Mccarthy, Patrick Mccarthy MR#:  [id number] DATE OF BIRTH:  April 25, 1941  DATE OF CONSULTATION:  10/24/2011  REFERRING PHYSICIAN:  Delfino Lovett, MD  CONSULTING PHYSICIAN:  Brentley Horrell K. Sherryll Burger, MD  PRIMARY CARE PHYSICIAN: Dewaine Oats, MD    REASON FOR CONSULTATION: Slurred speech, staggering gait, and left facial weakness.   HISTORY OF PRESENT ILLNESS: Patrick Mccarthy is a 74 year old Caucasian gentleman with a history of multiple vascular risk factors such as diabetes, hypertension, hyperlipidemia, peripheral arterial disease, and history of carotid stenosis status post left CEA who came in due to sudden onset of left facial weakness that came on around January 19th or the 20th.   They felt like it has gotten better, that initially he was having some difficulty walking as well but that has gotten better. The family felt like his speech was slurred but the patient felt like it was okay. He was not having any significant difficulty chewing or swallowing.   He denied any dizziness, vision problem, hearing changes, or difficulty with visual-spatial orientation, etc.   Family did not feel like he was confused. He was not having any fever, etc.   The patient has a history of stroke. The first one was around six years ago. Per his MRI, he seemed to have right putamen infarct and right caudate extending up to the right corona radiata infarct.   He was not taking aspirin on a regular basis. He does chew tobacco.   PAST MEDICAL HISTORY:  1. Diabetes. 2. Hypertension. 3. Hyperlipidemia. 4. Peripheral arterial disease. 5. Probable chronic kidney disease.    PAST SURGICAL HISTORY:  1. Skin cancer resection from the face. 2. Appendectomy.  3. Hernia repair.   ALLERGIES: He does not have any known drug allergies.  MEDICATIONS: I reviewed his medication list.   FAMILY HISTORY: Positive for father having a stroke. Son with diabetes.   SOCIAL HISTORY: He lives in [place] with his wife. He quit tobacco  in 2009 but he chews it. He does not do alcohol or any drugs. He is retired from a mill.   REVIEW OF SYSTEMS: Positive for left facial weakness, left arm and leg numbness(which is old), some difficulty walking which has gotten better, some slurred speech which he feels is getting better. Rest of the 10 system review of system was asked and was found to be negative.   PHYSICAL EXAMINATION:   VITAL SIGNS: Temperature 98, respiratory rate 20, heart rate 63, blood pressure 164/76, pulse oximetry 92% on 2 liters of oxygen. Fingerstick glucose was 251.   GENERAL: He is an elderly looking Caucasian gentleman surrounded by many family members sitting on the side of the bed eating.   LUNGS: Clear to auscultation.   HEART: S1, S2 heart sounds. Carotid exam did not reveal any bruit. He does have a scar on the left carotid region.   MENTAL STATUS EXAMINATION: He was alert, oriented, followed two-step commands. His attention, concentration, and memory seemed to be intact for his age.   On his cranial nerves, his pupils are equal, round, and reactive. Extraocular movements seemed to be intact. His visual fields seem to be full. His left face is weak. He does have a left ptosis. His tongue was midline. Facial sensations were intact. His hearing was intact.   On his motor examination, he does have limited range of motion in his left shoulder and elbow.   He did not have any pronator drift. He seemed to have 5 out of 5 strength  of his upper and lower extremities.   His sensations were decreased in the left upper and lower extremity.   His reflexes were symmetric. He does have reduced to absent ankle jerks bilaterally.   I did not check his gait.   REVIEW OF RADIOLOGICAL DATA: On his MRI of the brain he had some white matter disease. He has a new infarct into his right upper pontine region into the distribution of pontine paramedian vessels from the basilar artery.   He had old stroke into his right  putamen and another stroke in his right caudate extending up to coronary radiata region.   ASSESSMENT AND PLAN: New stroke, likely small vessel disease into his upper pons in the territory of pontine paramedian branch of basilar artery.   He was on aspirin before but his compliance was not great but he has been already switched to Plavix so we should keep that.   The patient has multiple other poorly controlled risk factors such as his hemoglobin A1c of 9.3.   Family mentioned that he continued to use nicotine in terms of chewing.   He feels like that he might not take his antihypertensive medications on a regular basis.   I had an extensive discussion with the patient and family regarding secondary stroke prevention.   Would like to have a consult with Endocrine but the son would like to take him to his primary care doctor so they will make a decision and let me know.   In terms of his stroke work-up, I agree with continuing telemetry monitoring. He had ultrasound of his carotid vessels which was unremarkable.   His EKG is okay but I did not see echocardiogram report.   The patient does not have any evidence of deep vein thrombosis in his lower extremities.   I encouraged the patient to participate in speech therapy and swallow evaluation which he felt was normal.   Feel free to contact me with any further questions. I will see him on an outpatient basis. It was my pleasure to see this gentleman.   ____________________________ Patrick Mccarthy K. Sherryll BurgerShah, MD hks:drc D: 10/24/2011 18:12:07 ET T: 10/25/2011 08:25:26 ET JOB#: [id number]  cc: Kaisey Huseby K. Sherryll BurgerShah, MD, <Dictator> Jillene Bucksenny C. Arlana Pouchate, MD [facility] Kirtland BouchardK [facility] MD ELECTRONICALLY SIGNED 10/28/2011 7:26

## 2015-01-25 NOTE — Discharge Summary (Signed)
PATIENT NAME:  Patrick Mccarthy, Patrick Mccarthy MR#:  [id number] DATE OF BIRTH:  05-31-1941  DATE OF ADMISSION:  10/23/2011 DATE OF DISCHARGE:  10/25/2011  DISCHARGE DIAGNOSES:  1. Acute cerebrovascular accident, right pontine infarct. 2. Hypertension. 3. Diabetes. 4. Hyperlipidemia. 5. Peripheral vascular disease with previous left carotid endarterectomy. 6. Mild chronic kidney disease. 7. Acute on chronic renal failure with mild chronic kidney disease, now improved.   CONSULTANT: Cristopher Peru, MD - Neurology  HOSPITAL COURSE: This is a 74 year old male who has history of diabetes, hypertension, and hyperlipidemia. He has previous history of left carotid endarterectomy with peripheral vascular disease and probable chronic kidney disease with a baseline creatinine of 1.5. He presented with slurred speech, ataxia, and left facial droop. He was admitted as acute stroke. When he came in, he had a CT of the head done without contrast which showed no acute intracranial process. He was taking aspirin at home which was changed to Plavix. He has already been taking statin at home, Pravastatin 80 mg at bedtime, and his lipid profile is stable on that. His LDL was found to be 59. We will continue that at home. His hemoglobin A1c was found to be 9.3. He is on metformin and glipizide. Initially metformin was stopped because his creatinine was 1.45 when he came in, but that improved to 1.25 at discharge. We will resume metformin. If his creatinine is worsening then his metformin may need to be stopped. He had a MRI of the brain done without contrast which showed an acute nonhemorrhagic right pontine infarct. His speech has improved. His ataxia has improved. He was evaluated by physical therapy. He opted for outpatient physical therapy, which we will refer him to. He had an ultrasound of the carotids done bilaterally but did not show any significant hemodynamic stenosis, antegrade flow in both vertebrals. His echocardiogram showed  that he has an ejection fraction of 40 to 45%, mildly reduced, impaired LV relaxation, mild concentric left ventricular hypertrophy, and right ventricle pressures are normal. He takes lisinopril at home, which was stopped initially because of his creatinine worsening, but since it has improved I will decrease his lisinopril to 20 mg once daily and add low dose beta blocker to the regimen also for better blood pressure control. His chest x-ray was negative. He also had a left shoulder x-ray done because of pain, but it did not show any acute abnormality. He was slightly hypomagnesemic yesterday with a magnesium of 1.6. He got 2 grams of magnesium sulfate. His sodium was a little bit up yesterday at 146, most likely because of IV hydration. He is well-hydrated right now. His mild hypernatremia was because of the normal saline he received. He should have a follow-up BMP as an outpatient in a week to see how sodium and magnesium are doing. His troponins were all negative. His EKG when he came in showed that he had sinus rhythm, nonspecific ST-T wave abnormality.   DISCHARGE MEDICATIONS:  1. Pravastatin 80 mg at bedtime. 2. Glipizide 10 mg twice a day. 3. Metformin 1000 mg twice a day. 4. Change lisinopril to 20 mg p.o. once daily.  5. Metoprolol 25 mg p.o. twice a day. 6. Plavix 75 mg once daily.  DIET: I advised a low sodium, ADA, low-cholesterol, and mechanical soft diet; no straws.   CONDITION AT DISCHARGE: He is comfortable. He is ambulating around. T-max is 97.1, heart rate 61, blood pressure 165/64, and saturating 95% on room air. Chest is clear. Heart sounds are regular.  Abdomen is soft and nontender. He is awake, alert, and oriented x3. His speech has improved. He has some left facial palsy. No pronator drift. Ambulating.  DISCHARGE INSTRUCTIONS/FOLLOWUP: He is to followup with Dr. Arlana Pouchate next week. He is to have followup BMP and magnesium level in Dr. Maree Krabbeate's office. [facility]emang [facility]. Outpatient physical therapy.  TIME SPENT: 50 minutes. ____________________________ Fredia SorrowAbhinav Elynn Patteson, MD ag:slb D: 10/25/2011 11:12:16 ET     T: 10/26/2011 10:19:19 ET        JOB#: [id number] cc: Fredia SorrowAbhinav Jacere Pangborn, MD, <Dictator> Jillene Bucksenny C. Arlana Pouchate, MD Hemang K. Sherryll BurgerShah, MD Fredia SorrowABHINAV Harveer Sadler MD ELECTRONICALLY SIGNED 11/24/2011 13:48
